# Patient Record
Sex: Male | Born: 1935 | Race: White | Hispanic: No | Marital: Married
Health system: Southern US, Community
[De-identification: ages and names within clinical notes are randomized; demographics above are authoritative.]

---

## 2007-02-22 ENCOUNTER — Ambulatory Visit: Payer: Self-pay | Admitting: Internal Medicine

## 2007-05-14 ENCOUNTER — Ambulatory Visit: Payer: Self-pay | Admitting: Internal Medicine

## 2008-06-02 ENCOUNTER — Ambulatory Visit: Payer: Self-pay | Admitting: Internal Medicine

## 2009-02-04 ENCOUNTER — Ambulatory Visit: Payer: Self-pay | Admitting: Family Medicine

## 2009-02-24 ENCOUNTER — Ambulatory Visit: Payer: Self-pay | Admitting: Family Medicine

## 2009-03-24 ENCOUNTER — Ambulatory Visit: Payer: Self-pay | Admitting: Family Medicine

## 2009-04-23 ENCOUNTER — Ambulatory Visit: Payer: Self-pay | Admitting: Family Medicine

## 2009-09-16 ENCOUNTER — Ambulatory Visit: Payer: Self-pay | Admitting: Gastroenterology

## 2010-09-18 ENCOUNTER — Ambulatory Visit: Payer: Self-pay | Admitting: Family Medicine

## 2010-09-24 ENCOUNTER — Ambulatory Visit: Payer: Self-pay

## 2011-03-15 LAB — COMPREHENSIVE METABOLIC PANEL
Albumin: 3.7 g/dL (ref 3.4–5.0)
Alkaline Phosphatase: 50 U/L (ref 50–136)
BUN: 15 mg/dL (ref 7–18)
Bilirubin,Total: 0.4 mg/dL (ref 0.2–1.0)
Chloride: 106 mmol/L (ref 98–107)
Co2: 25 mmol/L (ref 21–32)
Creatinine: 1.04 mg/dL (ref 0.60–1.30)
EGFR (African American): >60
Potassium: 4.2 mmol/L (ref 3.5–5.1)
SGOT(AST): 25 U/L (ref 15–37)
SGPT (ALT): 25 U/L
Sodium: 142 mmol/L (ref 136–145)
Total Protein: 7 g/dL (ref 6.4–8.2)

## 2011-03-15 LAB — CBC
MCHC: 33.4 g/dL (ref 32.0–36.0)
MCV: 89 fL (ref 80–100)
Platelet: 128 10*3/uL — ABNORMAL LOW (ref 150–440)
RBC: 4.67 10*6/uL (ref 4.40–5.90)
RDW: 15.9 % — ABNORMAL HIGH (ref 11.5–14.5)
WBC: 6.9 10*3/uL (ref 3.8–10.6)

## 2011-03-15 LAB — TROPONIN I: Troponin-I: <0.02 ng/mL

## 2011-03-15 LAB — PROTIME-INR: Prothrombin Time: 22.7 secs — ABNORMAL HIGH (ref 11.5–14.7)

## 2011-03-15 LAB — CK TOTAL AND CKMB (NOT AT ARMC): CK-MB: 3.7 ng/mL — ABNORMAL HIGH (ref 0.5–3.6)

## 2011-03-16 ENCOUNTER — Inpatient Hospital Stay: Payer: Self-pay | Admitting: Student

## 2011-03-16 LAB — CK TOTAL AND CKMB (NOT AT ARMC)
CK, Total: 246 U/L — ABNORMAL HIGH (ref 35–232)
CK-MB: 4.3 ng/mL — ABNORMAL HIGH (ref 0.5–3.6)
CK-MB: 4.2 ng/mL — ABNORMAL HIGH (ref 0.5–3.6)

## 2011-03-16 LAB — PRO B NATRIURETIC PEPTIDE: B-Type Natriuretic Peptide: 50 pg/mL (ref 0–450)

## 2011-03-16 LAB — APTT
Activated PTT: >160 secs (ref 23.6–35.9)
Activated PTT: >160 secs (ref 23.6–35.9)

## 2011-03-16 LAB — TROPONIN I: Troponin-I: <0.02 ng/mL

## 2011-03-17 LAB — BASIC METABOLIC PANEL
Anion Gap: 17 — ABNORMAL HIGH (ref 7–16)
BUN: 12 mg/dL (ref 7–18)
Calcium, Total: 8.8 mg/dL (ref 8.5–10.1)
Chloride: 106 mmol/L (ref 98–107)
Co2: 21 mmol/L (ref 21–32)
Creatinine: 0.89 mg/dL (ref 0.60–1.30)
EGFR (African American): >60
EGFR (Non-African Amer.): >60
Glucose: 113 mg/dL — ABNORMAL HIGH (ref 65–99)
Osmolality: 287 (ref 275–301)
Potassium: 3.9 mmol/L (ref 3.5–5.1)
Sodium: 144 mmol/L (ref 136–145)

## 2011-03-17 LAB — CBC WITH DIFFERENTIAL/PLATELET
Eosinophil #: 0.4 10*3/uL (ref 0.0–0.7)
Eosinophil %: 6.2 %
HCT: 43.9 % (ref 40.0–52.0)
Lymphocyte %: 29.1 %
MCHC: 33.2 g/dL (ref 32.0–36.0)
Monocyte #: 0.6 10*3/uL (ref 0.0–0.7)
Neutrophil %: 53.3 %
Platelet: 137 10*3/uL — ABNORMAL LOW (ref 150–440)
RDW: 16.3 % — ABNORMAL HIGH (ref 11.5–14.5)

## 2011-03-17 LAB — PROTIME-INR
INR: 2.1
Prothrombin Time: 23.5 secs — ABNORMAL HIGH (ref 11.5–14.7)

## 2011-03-17 LAB — MAGNESIUM: Magnesium: 2.2 mg/dL

## 2011-03-17 LAB — APTT: Activated PTT: >160 secs (ref 23.6–35.9)

## 2011-04-03 ENCOUNTER — Emergency Department: Payer: Self-pay | Admitting: Emergency Medicine

## 2011-04-03 LAB — URINALYSIS, COMPLETE
Glucose,UR: NEGATIVE mg/dL (ref 0–75)
Nitrite: POSITIVE
Protein: 30
WBC UR: 139 /HPF (ref 0–5)

## 2011-04-03 LAB — DIFFERENTIAL
Basophil %: 0.7 %
Lymphocyte #: 0.7 10*3/uL — ABNORMAL LOW (ref 1.0–3.6)
Lymphocyte %: 8.3 %
Monocyte %: 7.2 %
Neutrophil #: 7.3 10*3/uL — ABNORMAL HIGH (ref 1.4–6.5)

## 2011-04-03 LAB — CBC
HCT: 43.1 % (ref 40.0–52.0)
HGB: 14.4 g/dL (ref 13.0–18.0)
MCV: 90 fL (ref 80–100)
WBC: 8.8 10*3/uL (ref 3.8–10.6)

## 2011-04-03 LAB — COMPREHENSIVE METABOLIC PANEL
Anion Gap: 14 (ref 7–16)
BUN: 17 mg/dL (ref 7–18)
Chloride: 105 mmol/L (ref 98–107)
Co2: 23 mmol/L (ref 21–32)
Creatinine: 1.1 mg/dL (ref 0.60–1.30)
EGFR (African American): >60
Potassium: 3.7 mmol/L (ref 3.5–5.1)
SGOT(AST): 22 U/L (ref 15–37)
SGPT (ALT): 20 U/L
Sodium: 142 mmol/L (ref 136–145)

## 2011-04-03 LAB — APTT: Activated PTT: 36.8 secs — ABNORMAL HIGH (ref 23.6–35.9)

## 2011-07-03 ENCOUNTER — Ambulatory Visit: Payer: Self-pay

## 2011-07-03 LAB — CBC WITH DIFFERENTIAL/PLATELET
Basophil %: 0.3 %
Eosinophil #: 0.4 10*3/uL (ref 0.0–0.7)
Eosinophil %: 5.4 %
Lymphocyte #: 2.2 10*3/uL (ref 1.0–3.6)
MCH: 30.4 pg (ref 26.0–34.0)
MCV: 91 fL (ref 80–100)
Monocyte #: 0.8 x10 3/mm (ref 0.2–1.0)
Monocyte %: 11.2 %
Neutrophil %: 51.2 %
RDW: 15.5 % — ABNORMAL HIGH (ref 11.5–14.5)

## 2011-07-03 LAB — COMPREHENSIVE METABOLIC PANEL
Albumin: 3.9 g/dL (ref 3.4–5.0)
Alkaline Phosphatase: 73 U/L (ref 50–136)
Chloride: 106 mmol/L (ref 98–107)
Co2: 24 mmol/L (ref 21–32)
Creatinine: 1.01 mg/dL (ref 0.60–1.30)
EGFR (African American): >60
Glucose: 117 mg/dL — ABNORMAL HIGH (ref 65–99)
Potassium: 4 mmol/L (ref 3.5–5.1)
SGOT(AST): 24 U/L (ref 15–37)
SGPT (ALT): 29 U/L
Total Protein: 7.6 g/dL (ref 6.4–8.2)

## 2011-07-03 LAB — URINALYSIS, COMPLETE
Blood: NEGATIVE
Glucose,UR: NEGATIVE mg/dL (ref 0–75)
Leukocyte Esterase: NEGATIVE
Specific Gravity: 1.02 (ref 1.003–1.030)
Squamous Epithelial: NONE SEEN

## 2011-07-03 LAB — LIPASE, BLOOD: Lipase: 112 U/L (ref 73–393)

## 2011-08-04 ENCOUNTER — Ambulatory Visit: Payer: Self-pay | Admitting: Gastroenterology

## 2011-08-31 ENCOUNTER — Ambulatory Visit: Payer: Self-pay | Admitting: Internal Medicine

## 2013-11-14 IMAGING — CT CT STONE STUDY
1 of 2 series · 15 of 32 positions shown, 19 images · non-contrast
Comparison: none

REASON FOR EXAM: LLQ pain and difficulty urinating
COMMENTS:

PROCEDURE:     CT  - CT ABDOMEN /PELVIS WO (STONE)  - April 03, 2011 [DATE]
RESULT:     Comparison: 04/23/2009
TECHNIQUE: Multiple axial images from the lung bases to the symphysis pubis
were obtained without oral and without intravenous contrast.

[Series 2: stone · axial · 0.76mm/px · z∈[-398,+20]mm · 15 of 151 slices shown, 19 images]
[im 6/151  soft-tissue]
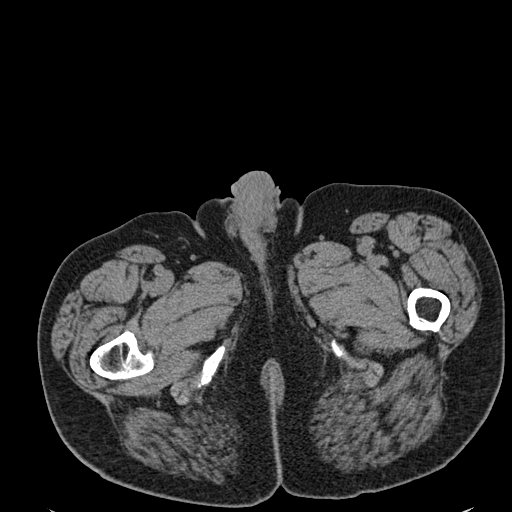
[im 6/151  bone]
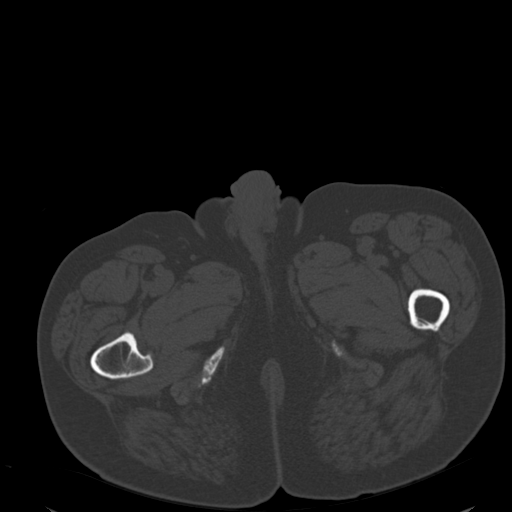
[im 18/151  soft-tissue]
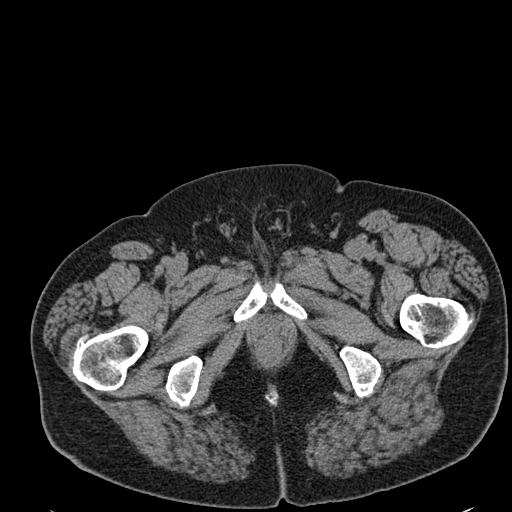
[im 29/151  soft-tissue]
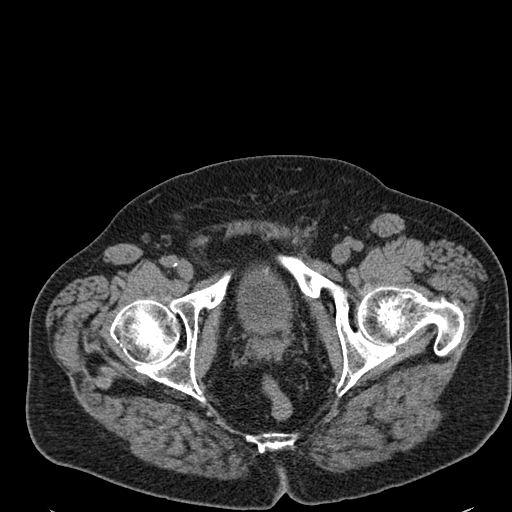
[im 41/151  soft-tissue]
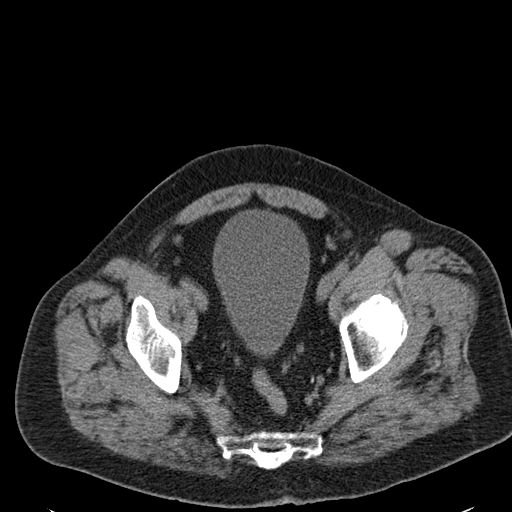
[im 52/151  soft-tissue]
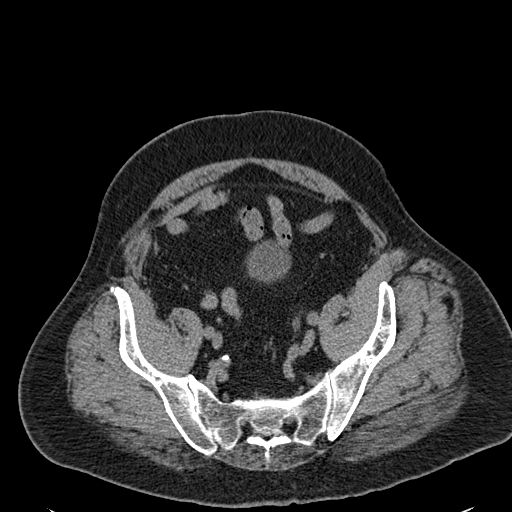
[im 64/151  soft-tissue]
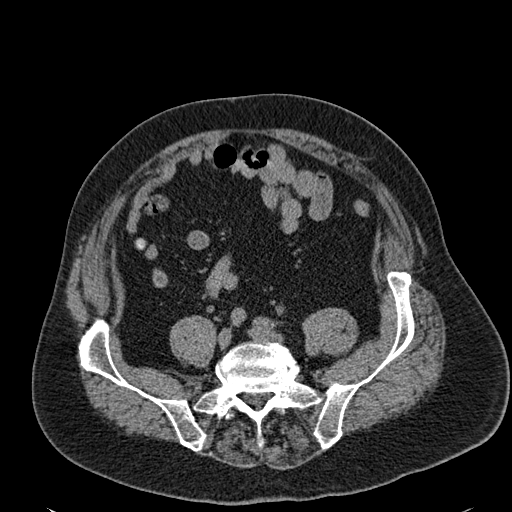
[im 76/151  soft-tissue]
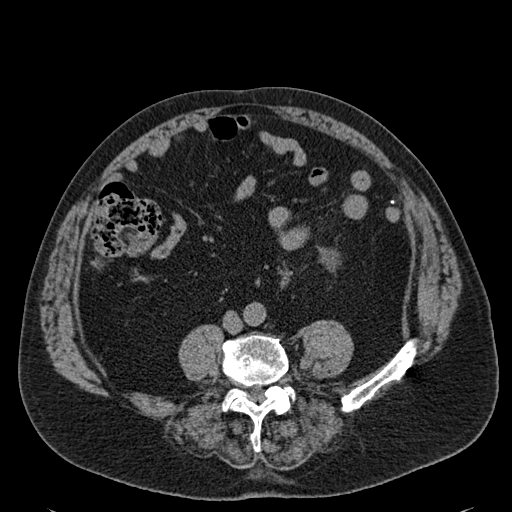
[im 87/151  soft-tissue]
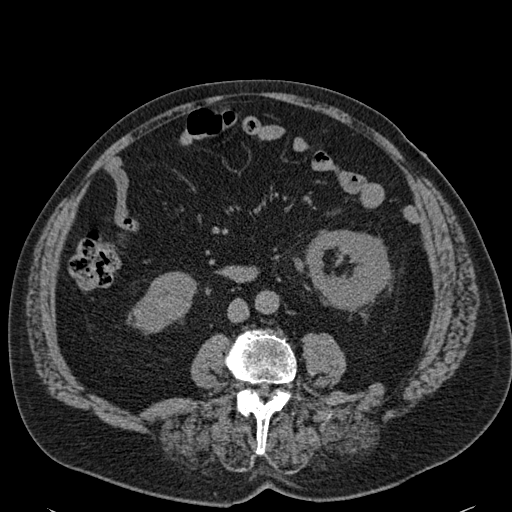
[im 99/151  soft-tissue]
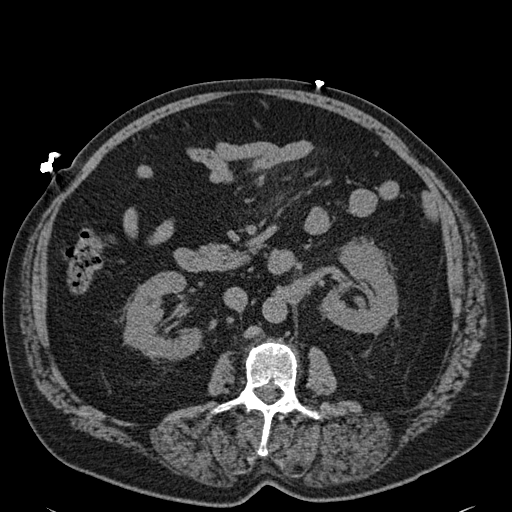
[im 99/151  bone]
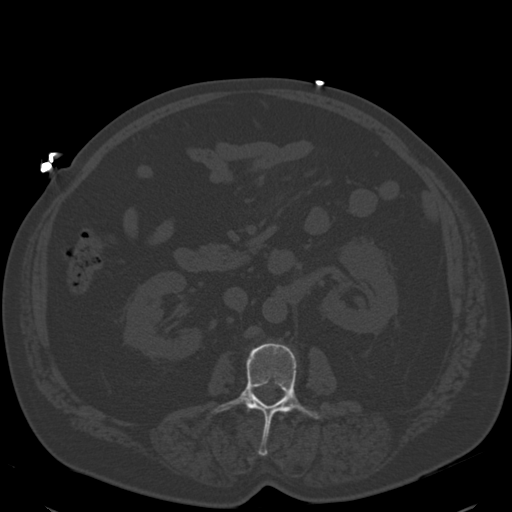
[im 110/151  soft-tissue]
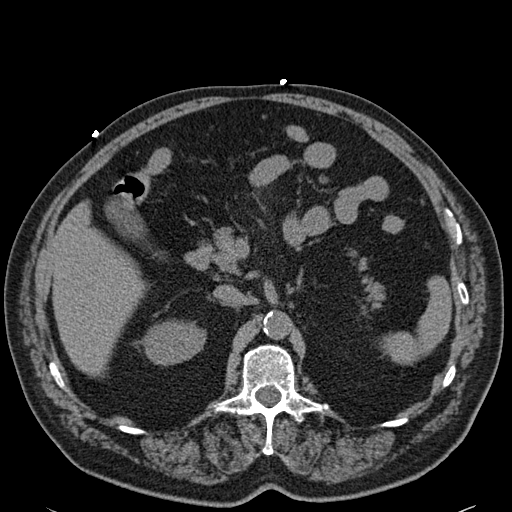
[im 122/151  soft-tissue]
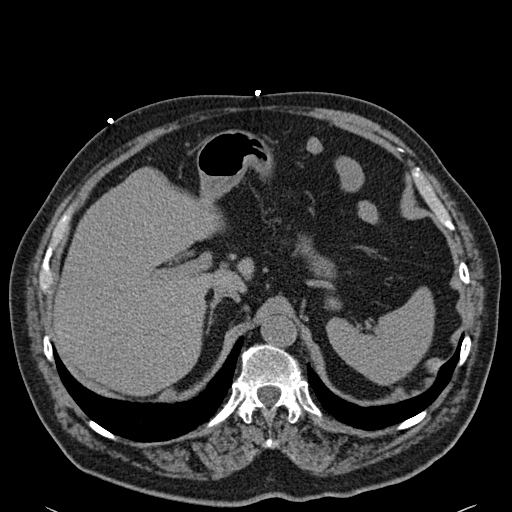
[im 127/151  lung]
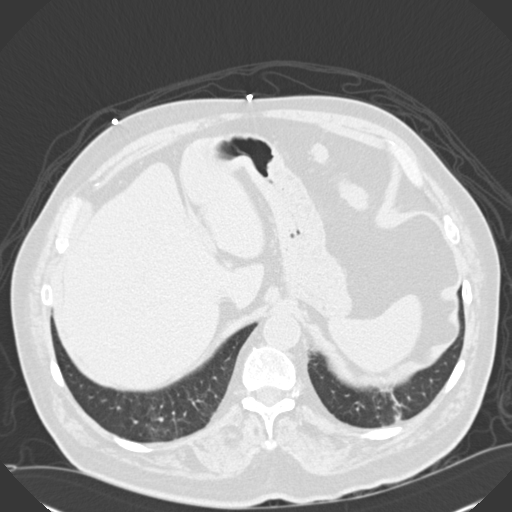
[im 133/151  soft-tissue]
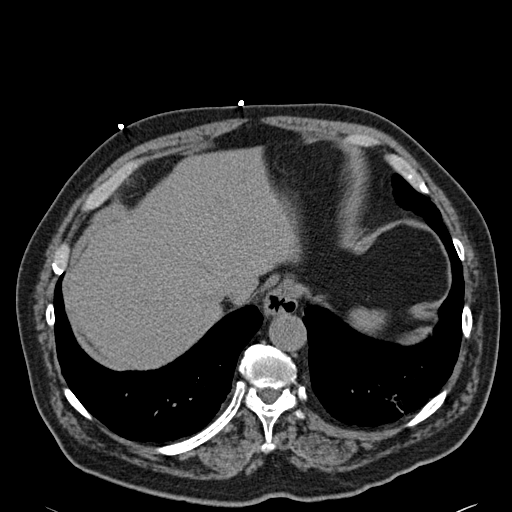
[im 133/151  lung]
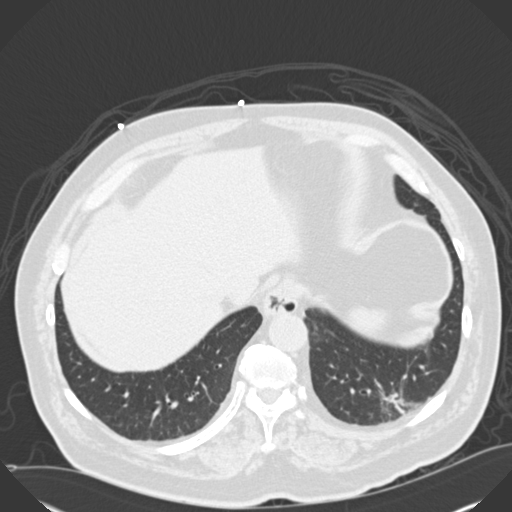
[im 139/151  lung]
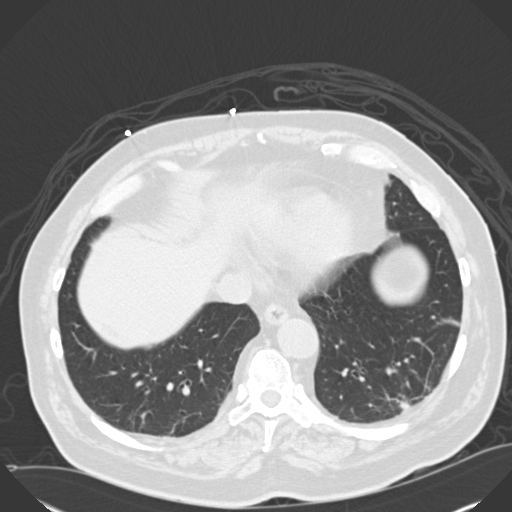
[im 145/151  soft-tissue]
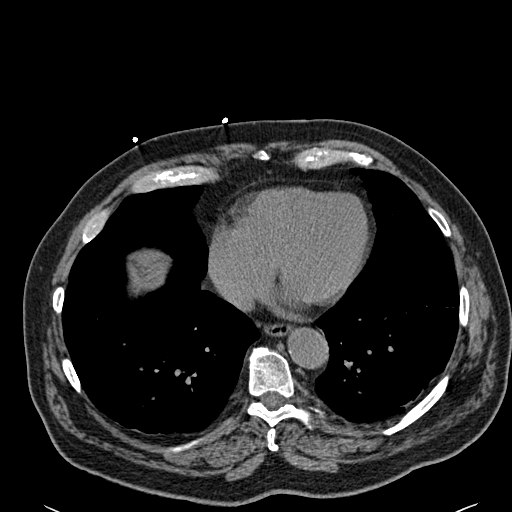
[im 145/151  lung]
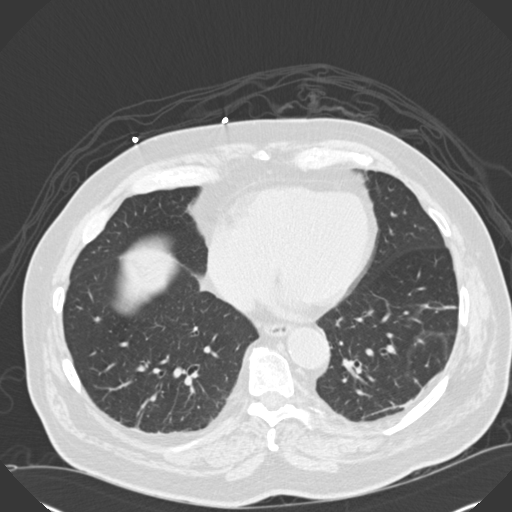

[15 of 32 positions shown; findings below may reference images not displayed]

FINDINGS: Linear opacities in the left lower lobe likely represent atelectasis.

Lack of intravenous contrast limits evaluation of the solid abdominal
organs.  Grossly, the liver, gallbladder, spleen, adrenals, and pancreas are
unremarkable. There is a small hiatal hernia. A 2 mm calculus is seen in the
interpolar region of the right kidney. There is slight prominence of the
left ureter and mild left periureteral stranding which is nonspecific. No
calculus identified in the left ureter. There is mild perinephric stranding
bilaterally, which is nonspecific.

The small and large bowel are normal in caliber. There is minimal stranding
in the mesenteric fat, which is nonspecific. The appendix is not identified.
There are no inflammatory changes at the base of the cecum. There are
nonspecific calcifications in the prostate.

No aggressive lytic or sclerotic osseous lesions are identified.
IMPRESSION: 1. Mild prominence of the left ureter and mild left periureteral stranding
is nonspecific. This may be infectious or inflammatory. Correlate for
recently passed stone.
2. Right-sided nephrolithiasis, without hydronephrosis.

## 2014-03-17 IMAGING — CT CT ABD-PELV W/ CM
1 of 2 series · 15 of 32 positions shown, 19 images · IV contrast (isovue)
Comparison: 04/03/2011

REASON FOR EXAM: gastroenteritis  generalized abd pain
COMMENTS:

PROCEDURE:     GAZOUANI - GAZOUANI ABDOMEN / PELVIS W  - August 04, 2011 [DATE]
RESULT:     History: Gastroenteritis
TECHNIQUE: Multiple axial images of the abdomen and pelvis were performed
from the lung bases to the pubic symphysis, with p.o. contrast and with 100
ml of Isovue 300 intravenous contrast.

[Series 2: soft tissue · axial · 0.84mm/px · z∈[-548,-122]mm · 15 of 156 slices shown, 19 images]
[im 7/156  soft-tissue]
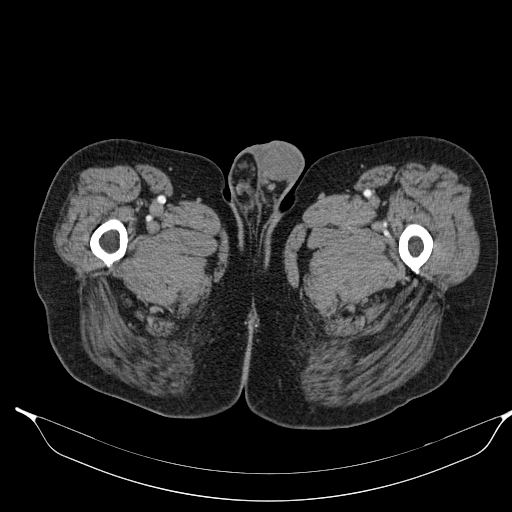
[im 7/156  bone]
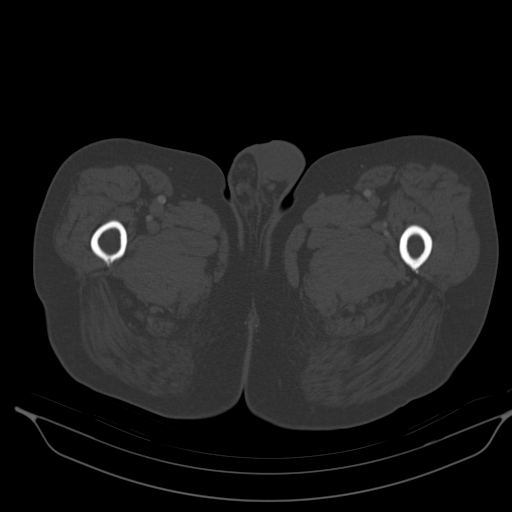
[im 20/156  soft-tissue]
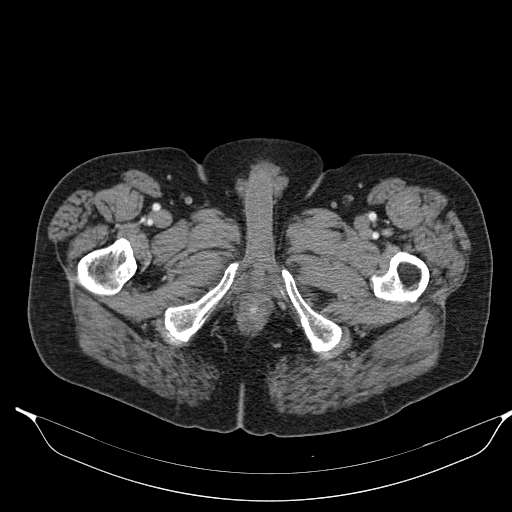
[im 33/156  soft-tissue]
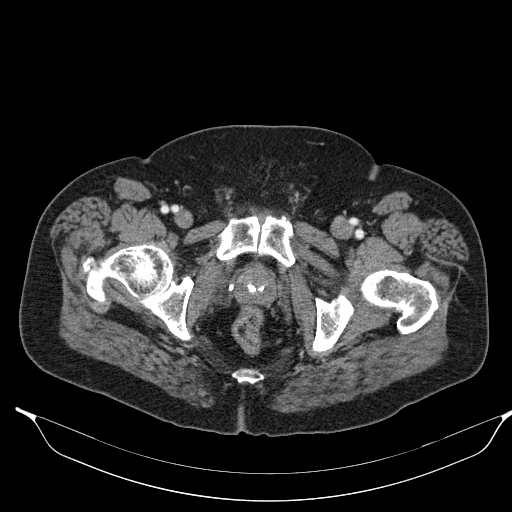
[im 46/156  soft-tissue]
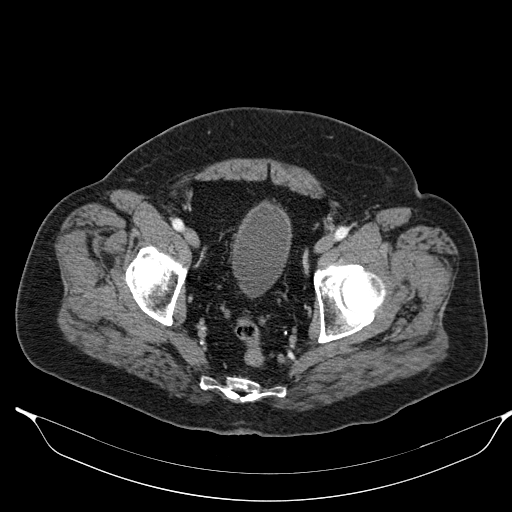
[im 52/156  soft-tissue]
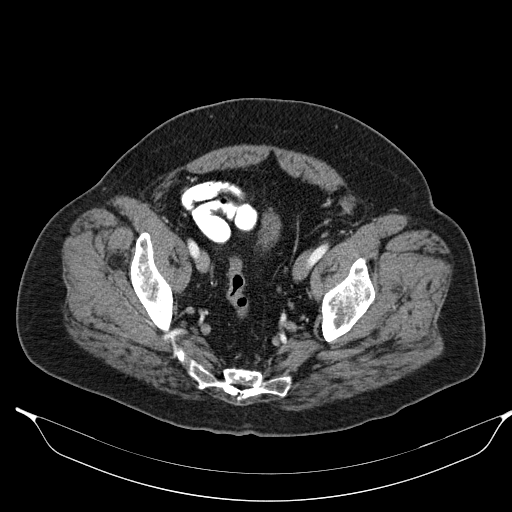
[im 65/156  soft-tissue]
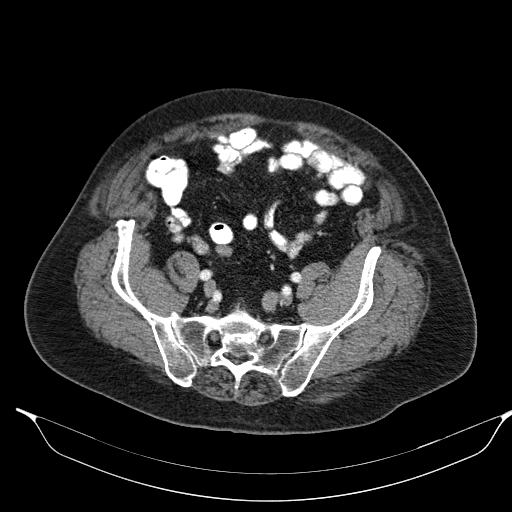
[im 78/156  soft-tissue]
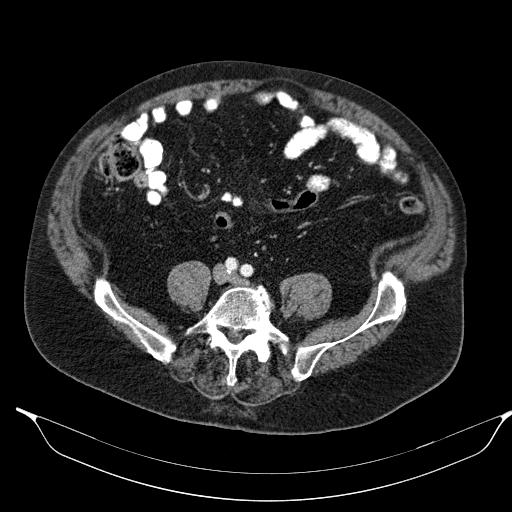
[im 91/156  soft-tissue]
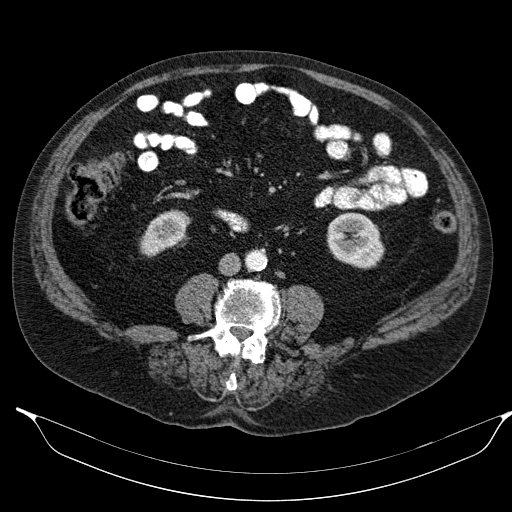
[im 104/156  soft-tissue]
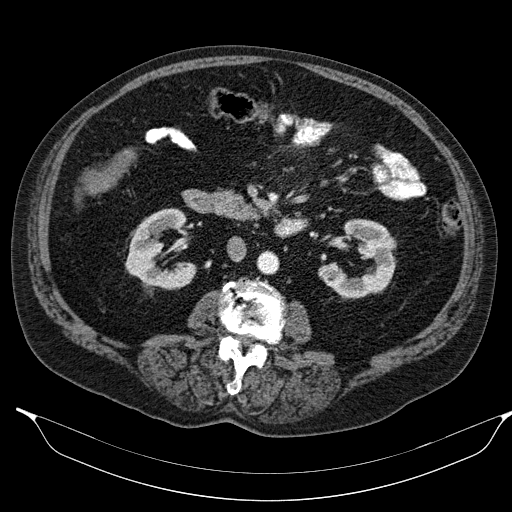
[im 104/156  bone]
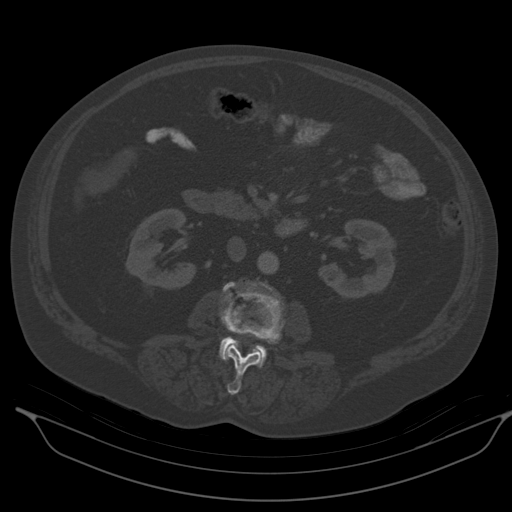
[im 110/156  soft-tissue]
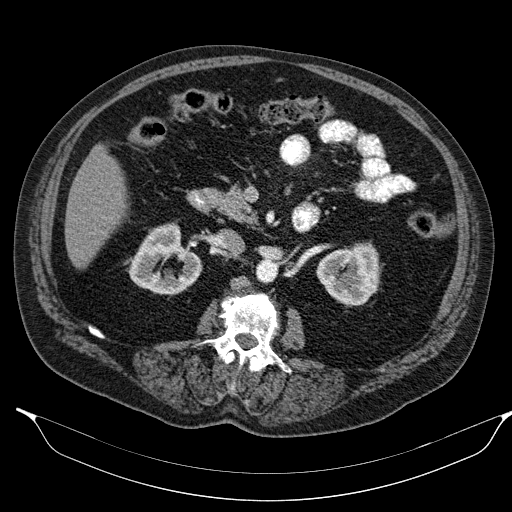
[im 123/156  soft-tissue]
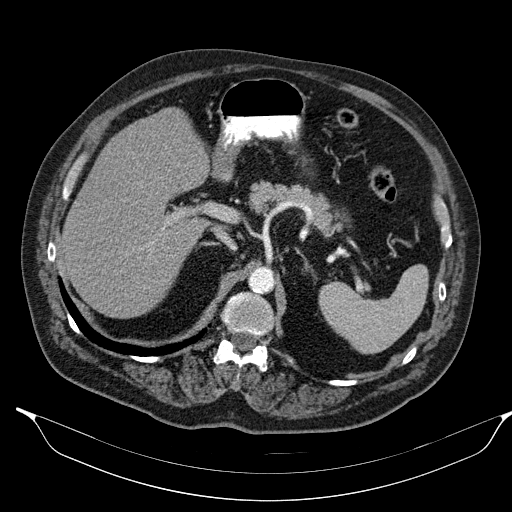
[im 130/156  lung]
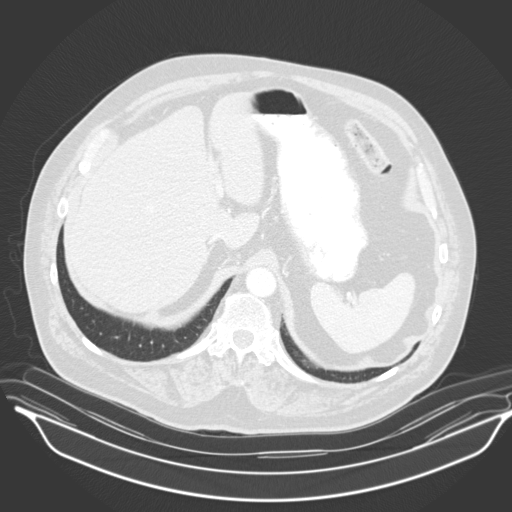
[im 136/156  soft-tissue]
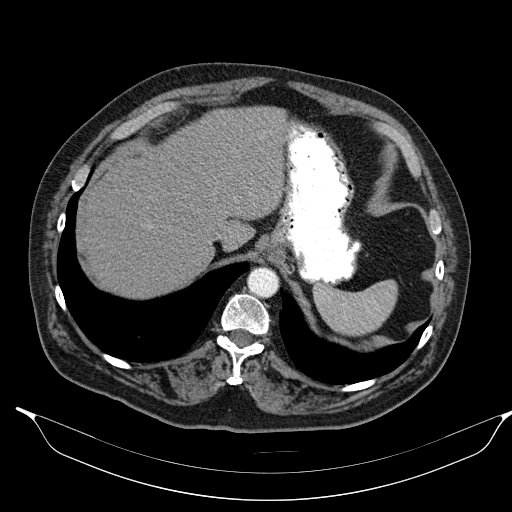
[im 136/156  lung]
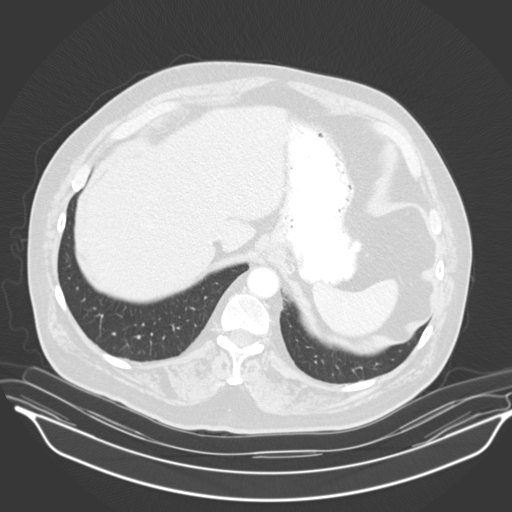
[im 143/156  lung]
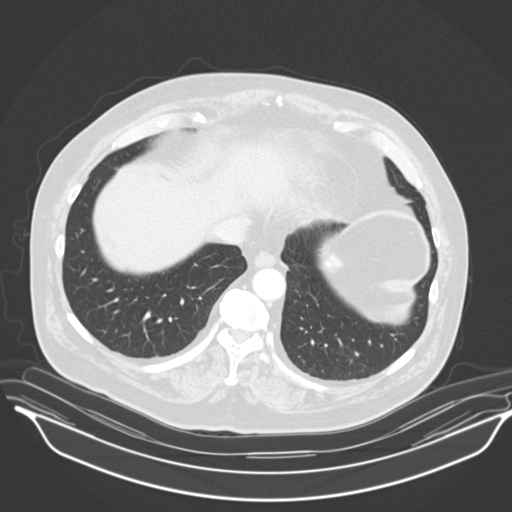
[im 149/156  soft-tissue]
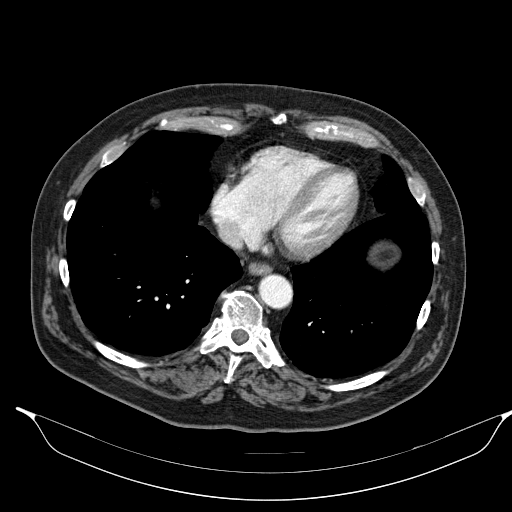
[im 149/156  lung]
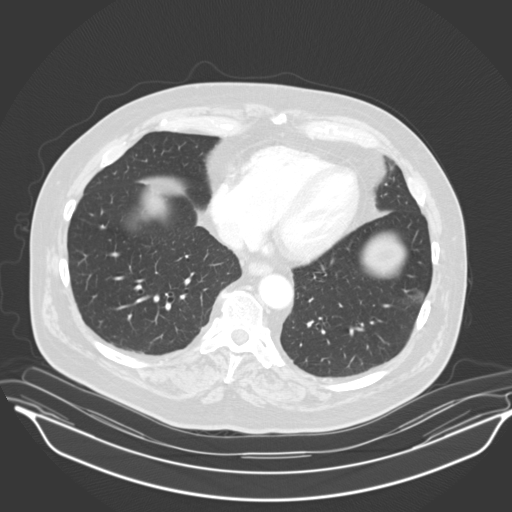

[15 of 32 positions shown; findings below may reference images not displayed]

FINDINGS: The lung bases are clear. There is no pneumothorax. The heart size is
normal.

The liver demonstrates no focal abnormality. There is no intrahepatic or
extrahepatic biliary ductal dilatation. The gallbladder is unremarkable. The
spleen demonstrates no focal abnormality. The kidneys, adrenal glands, and
pancreas are normal. The bladder is unremarkable.

The stomach, duodenum, small intestine, and large intestine demonstrate no
contrast extravasation or dilatation. There is mild haziness in the left
side of the mesentery with a few nonpathologically enlarged lymph nodes
present as can be seen with mesenteritis or panniculitis which is similar in
appearance to the prior examination of 04/03/2011. There is no
pneumoperitoneum, pneumatosis, or portal venous gas. There is no abdominal
or pelvic free fluid. There is no lymphadenopathy.

The abdominal aorta is normal in caliber with atherosclerosis.

The osseous structures are unremarkable.
IMPRESSION: 1. There is mild haziness in the left side of the mesentery with a few
nonpathologically enlarged lymph nodes present as can be seen with
mesenteritis or panniculitis which is similar in appearance to the prior
examination of 04/03/2011.

[REDACTED]

## 2014-05-18 NOTE — Discharge Summary (Signed)
PATIENT NAME:  Austin Price, Austin Price MR#:  [id number] DATE OF BIRTH:  07-11-1932  DATE OF ADMISSION:  03/16/2011 DATE OF DISCHARGE:  03/17/2011  CHIEF COMPLAINT: Shortness of breath.   DISCHARGE DIAGNOSIS: Shortness of breath likely in the setting of previous pulmonary embolus.   SECONDARY DIAGNOSES:  1. Recent diagnosis of PE and DVT with questionable right heart strain in December 2012 at [facility], on Coumadin.  2. Vitamin D deficiency.  3. Osteopenia.  4. Diet controlled diabetes.  5. Hyperlipidemia.  6. Gastroesophageal reflux disease.  7. Colonic polyp.  8. Diverticulosis. 9. Hemorrhoids.  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  11. Hypogonadism.  12. Situational anxiety.  13. Thrombocytopenia, chronic.  14. Obstructive sleep apnea.  15. Degenerative joint disease and spinal stenosis.   DISCHARGE MEDICATIONS:  1. Coumadin 5 mg p.o. daily.  2. Benzonatate 100 mg 3 times a day as needed.  3. Prednisone 5 mg daily.  4. Aspirin 81 mg daily.  5. Magnesium oxide 250 mg daily.  6. Levothyroxine 50 mcg daily.  7. Co-Q Enzyme 10 200 mg once a day.  8. Atorvastatin 20 mg daily.  9. Cyclobenzaprine 5 mg at bedtime.  10. Calcium with Vitamin D 1 tab in the p.m.  11. Vitamin D3 2000 international units daily.  12. Omeprazole 40 mg daily.   FOLLOW-UP: Please follow-up with your primary care physician and check INR within the next week and iron goal should be between 2.5 to 3.   ACTIVITY: As tolerated.   DIET: Low sodium, ADA diet.   DISPOSITION: Home.   CONSULTANTS:  1. Dr. Sherrlyn HockPandit from Hematology   2. Dr. Purcell NailsKleinman from Dermatology    HISTORY OF PRESENT ILLNESS: The patient is a 81 year old gentleman with history of recent PE and DVT who was hospitalized in ICU requiring careful monitoring high FiO2 and was discharged on oxygen and Coumadin who presented with shortness of breath and left lower extremity swelling. For full details, please see the history and physical  dictated on 03/16/2011 by Dr. Margie EgePhichith. The patient was admitted to the hospitalist service for further evaluation of the shortness of breath.   SIGNIFICANT LABS AND IMAGING: BNP 50. Initial creatinine 1.04, sodium 142, potassium 4.2. DVT scan via ultrasound of the left lower extremity did not show any evidence for DVT. LFTs within normal limits. Troponins have been negative x3. Initial WBC 6.9, hemoglobin 13.6, platelets 128. INR on arrival 2; INR on discharge 2.1. D-dimer done this morning was 0.44 essentially within normal limits on the upper limits side. CT chest PE protocol showing pulmonary embolus within the right main pulmonary artery. X-ray of the chest, one view, showing no acute cardiopulmonary disease.   HOSPITAL COURSE: The patient was admitted on telemetry and started on heparin drip and initially Coumadin was held. Initial thought was perhaps the patient had a repeat PE in the setting of a DVT from the swelling in the left lower extremity. The patient underwent a Doppler of the lower extremity which did not show any DVT. Troponins were cycled to rule out acute coronary syndrome which have been all negative. While hospitalized the patient did not have any further episodes of shortness of breath or have any chest pain. The left lower extremity resolved spontaneously. Hematology was consulted. The patient also complained of a rash in the extremities. Furthermore, Dermatology was consulted. Dr. Sherrlyn HockPandit saw the patient from Hematology and recommended repeating a CAT scan as well as a D-dimer. The CAT scan did show pulmonary embolism in  the right lung, however, this appears to be a much lower burden from the previous PE. Per outpatient records we obtained from [facility], there is a reading that shows acute bilateral central and, to a lesser extent, distal PE moderate to severe clot burden. Here the D-dimer was on the higher side of normal, however, essentially negative.  Furthermore, the CAT scan here showed lesser clot burden. Dr. Sherrlyn Hock gave option of Lovenox versus Coumadin with a higher INR goal of 2.5 to 3 and the patient chose Coumadin with INR at a higher goal rate. At this point, he has had no further shortness of breath, has improvement and resolution of the lower extremity edema. He will be discharged with Coumadin 5 mg dose. INR should be followed up as an outpatient within the next week and Coumadin titrated for that higher goal. He was essentially ruled out for acute coronary syndrome with cyclic troponins x3. A BNP was checked and it was within normal limits suggesting the shortness of breath which resolved spontaneously as well shortly after hospitalization was not CHF related. For the petechiae, he was seen by Dermatology who believed that this rash which involves the lower extremity mostly is likely in the setting of lower extremity edema and no sign of vasculitis was seen. The petechiae has improved since arrival. He can follow-up with Dermatology as an outpatient if needed. At this point, given the resolution of his symptoms of shortness of breath and lower extremity edema, he will be discharged with the higher dose of Coumadin.   TOTAL TIME SPENT: 35 minutes.   CODE STATUS: The patient is FULL CODE.  ____________________________ Krystal Eaton, MD sa:drc D: 03/17/2011 10:35:16 ET T: 03/17/2011 11:48:40 ET JOB#: [id number]  cc: Krystal Eaton, MD, <Dictator> Jorje Guild. Beckey Downing, MD Sandeep R. Sherrlyn Hock, MD Krystal Eaton MD ELECTRONICALLY SIGNED 03/26/2011 8:56

## 2014-05-18 NOTE — H&P (Signed)
PATIENT NAME:  Austin Price, Austin Price MR#:  [id number] DATE OF BIRTH:  10/02/1932  DATE OF ADMISSION:  03/16/2011  REFERRING PHYSICIAN: Dr. Carollee MassedKaminski PRIMARY CARE PHYSICIAN: Mariah Millinganica Glass, FNP with Dr. Beckey DowningWillett    PRESENTING COMPLAINT: Chest pain, shortness of breath, left lower extremity swelling.   HISTORY OF PRESENT ILLNESS: Austin Price is a pleasant 81 year old gentleman with history of recent diagnosis of PE, deep vein thrombosis in December 2012 at [facility] on Coumadin, history of postpolio syndrome on steroids, osteopenia, diet controlled diabetes, hyperlipidemia, obstructive sleep apnea, spinal stenosis who presents with reports of developing left lower extremity swelling yesterday with diffuse body rash and development of shortness of breath at rest and on exertion as well as left-sided chest pain that has been on and off similar to when he presented in [place] urgent care. Apparently shortly before Christmas patient was visiting his [place] in [place] when he developed worsening shortness of breath and chest pain. Reports that his shortness of breath initially began prior to going to [facility] but was diagnosed with bronchitis. When symptoms worsened he got evaluated at urgent care. Apparently had syncope and collapse and was transferred to [facility] where he was diagnosed with what sounds like bilateral PE and deep venous thrombosis with right heart strain. Patient apparently was in the Intensive Care Unit. I do not have records available. His wife sent records home with her son. He denies any syncope or collapse this evening. No palpitations. Reports that his chest pain and shortness of breath seems improved as well as left lower extremity has improved since being here.   PAST MEDICAL HISTORY:  1. Recent diagnosis of PE and deep vein thrombosis and questionable right heart strain in December 2012 at [facility] on Coumadin.   2. Vitamin D  deficiency.  3. Osteopenia.  4. Diet-controlled diabetes.  5. Hyperlipidemia.  6. Gastroesophageal reflux disease.  7. Colonic polyps.  8. Diverticulosis.  9. Hemorrhoids.  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  11. Hypogonadism.  12. Situational anxiety.  13. Thrombocytopenia, chronic.  14. Obstructive sleep apnea. 15. Degenerative disc disease and spinal stenosis.   ALLERGIES: Penicillin.   MEDICATIONS:  1. Chlorpheniramine maleate 4 mg daily.  2. Calcium 500 mg 1 tablet daily.  3. Prednisone 5 mg daily.  4. Magnesium 250 mg daily.  5. Flexeril 5 mg 1 to 2 tablets at bedtime as needed; patient takes 5 mg at bedtime.  6. Aspirin 81 mg daily.  7. Atorvastatin 20 mg daily.  8. Tramadol 50 mg 1 to 2 tablets Price.i.d. as needed.  9. Coumadin 4 mg alternating with 5 mg; he is due for 5 mg dosing on Wednesday.   PAST SURGICAL HISTORY:  1. Left knee surgery x2.  2. Right knee surgery.  3. Appendectomy.  4. Tonsillectomy.  5. Bilateral cataract surgery.   FAMILY HISTORY: Cancer, heart disease, thyroid disease.   SOCIAL HISTORY: Lives in [place] with [place] with his wife. He quit tobacco 40 years ago. No drug use. He has occasional alcohol use.   REVIEW OF SYSTEMS: CONSTITUTIONAL: No fevers, nausea, vomiting. EYES: He has history of cataracts. ENT: No epistaxis or discharge. RESPIRATORY: No cough, wheezing. Reports shortness of breath at rest and dyspnea on exertion. No hemoptysis. CARDIOVASCULAR: Left-sided chest pain as per history of present illness and left lower extremity edema. No palpitations. He reports some intermittent dizziness. GENITOURINARY: No dysuria, hematuria. ENDO: No polyuria or polydipsia. HEMATOLOGIC: He has easy bruising. SKIN: Reports diffuse  rash of the lower extremity as well as face and body. MUSCULOSKELETAL: He has chronic joint pain and leg pain and back pain. NEUROLOGIC: No prior history of strokes or seizures. PSYCH: Denies any depression or suicidal  ideation.   PHYSICAL EXAMINATION:  VITAL SIGNS: Temperature 97.6, pulse 76, respiratory rate 18, blood pressure 142/73, sating at 95% on room air.   GENERAL: Lying in bed in no apparent distress.   HEENT: Normocephalic, atraumatic. Pupils equal, symmetric, nonicteric sclerae. Nares without discharge. Moist mucous membrane.   NECK: Soft supple. No adenopathy or JVP.   CARDIOVASCULAR: Non-tachy. No murmurs, rubs, or gallops.   LUNGS: Faint basilar crackles. No use of accessory muscles or increased respiratory effort.   ABDOMEN: Soft. Positive bowel sounds. No mass appreciated.   EXTREMITIES: 2+ pitting edema of the left lower extremity and 1+ of the right lower extremity. Dorsal pedis pulses intact. No erythema or warmth.   MUSCULOSKELETAL: No joint effusion.   SKIN: Diffuse petechial rash of the lower extremity as well as trunk and neck and face.   PSYCH: He is alert and oriented. Patient is cooperative.   NEUROLOGIC: Symmetrical strength. No focal deficits.   LABORATORY, DIAGNOSTIC AND RADIOLOGICAL DATA: CK 224. MB 3.7. Troponin less than 0.02. INR 2. Glucose 145, BUN 15, creatinine 1.04, sodium 142, potassium 4.2, chloride 106, carbon dioxide 25, calcium 8.8. LFTs within normal limits. WBC 6.9, hemoglobin 13.9, hematocrit 41.6, platelets 128, MCV 89. EKG with sinus rate of 81. No ST changes. Left lower extremity Doppler without evidence of deep vein thrombosis. There is perhaps Baker's cyst in the left popliteal region.   ASSESSMENT AND PLAN: Austin Price is a pleasant 81 year old gentleman with history diet-controlled diabetes, postpolio syndrome, spinal stenosis, obstructive sleep apnea, thrombocytopenia, recent diagnosis of bilateral PE and deep vein thrombosis December 2012 now on Coumadin presenting with acute onset of left lower extremity edema, chest pain, shortness of breath and diffuse rash. 1. Chest pain, shortness of breath concerning for recurrent deep venous thrombosis and  pulmonary embolus despite therapeutic INR. Left lower extremity swelling improved. Doppler, however, is negative. Per patient does not recall getting Doppler's performed at [facility]. Clinical and by history patient potentially could have developed acute clot now passed into his lungs and reason for development of acute shortness of breath and chest pain similar to his episode back in [place]. Will follow PT/INR but stop Coumadin for now. Start on heparin drip until seen by hematology. Get records from [facility]. Await recommendations from hematology prior to repeating CT chest. Continue on telemetry and cycle cardiac enzymes.  2. Diffuse rash, appears petechial platelet, however has remained stable. Questionable drug reaction. Get derm evaluation.   3. Diabetes, diet-controlled. Sliding scale insulin.  4. Postpolio syndrome. Restart his prednisone and Flexeril.  5. Hyperlipidemia. Restart atorvastatin.  6. Prophylaxis. On Coumadin and heparin drip. Will hold aspirin and continue Protonix.   TIME SPENT: Approximately 45 minutes spent on patient care.    ____________________________ Reuel Derby, MD ap:cms D: 03/16/2011 02:02:48 ET T: 03/16/2011 07:03:07 ET JOB#: [id number]  cc: Pearlean Brownie Gilmore List, MD, <Dictator> Coralyn Pear, FNP Reuel Derby MD ELECTRONICALLY SIGNED 03/22/2011 0:50

## 2014-05-18 NOTE — Consult Note (Signed)
PATIENT NAME:  Austin Price, Austin Price MR#:  [id number] DATE OF BIRTH:  1932/10/23  DATE OF CONSULTATION:  03/16/2011  REFERRING PHYSICIAN:  Alounthith Phichith, MD  CONSULTING PHYSICIAN:  Mack Thurmon R. Sherrlyn Hock, MD  REASON FOR CONSULTATION: Recent diagnosis of PE/deep vein thrombosis with? recurrence on Coumadin.   HISTORY OF PRESENT ILLNESS: The patient is a 81 year old gentleman with past medical history significant for diet-controlled diabetes, gastroesophageal reflux disease, hyperlipidemia, colon polyps, vitamin D deficiency, diverticulosis, hemorrhoids, postpolio syndrome, hypogonadism, anxiety, chronic thrombocytopenia, obstructive sleep apnea, degenerative disk disease and spinal stenosis, who states that he developed acute onset chest pain and shortness of breath around December 22nd and was in [place] and was evaluated at the nearby [facility]. Review of reports available from this shows that a CT scan of the chest on 01/15/2011 showed acute bilateral central pulmonary emboli and to a lesser extent distal pulmonary embolism, moderate-to-severe clot burden, multiple small mediastinal lymph nodes. The patient states that he was in the Intensive Care Unit, initially was on heparin, then on Lovenox and subsequently has been on Coumadin anticoagulation. He states that Coumadin has been regularly monitored and has been in the 2+ range. He was admitted to the hospital here last night with complaints of recurrent shortness of breath with sharp chest pain and also had noticed left lower extremity swelling below the knee. Evaluation so far shows  INR on admission was 2.0, left lower extremity Doppler negative for deep vein thrombosis, likely Baker's cyst in the popliteal fossa. The patient also had CT scan of the chest with PE protocol done today which reports pulmonary embolus present in the right main pulmonary artery, otherwise adequate opacification of pulmonary arteries seen. No  mediastinal adenopathy was reported. The patient has been on IV heparin since last night, states that left lower extremity swelling has significantly improved, denies pain. He also states that shortness of breath has also significantly improved and denies any chest pain currently. He denies bleeding symptoms. He did not have hypoxia, and the lowest reading of pulse oximetry was 91% on room air earlier this afternoon, but the patient clinically did not have worsening dyspnea or chest pain.   PAST MEDICAL HISTORY: As in history of present illness above.   PAST SURGICAL HISTORY:  1. Left knee surgery x2. 2. Right knee surgery.  3. Appendectomy.  4. Tonsillectomy. 5. Bilateral cataract surgery.   FAMILY HISTORY: Remarkable for heart disease, thyroid disease and malignancy. Denies hematological disorders, including thromboembolic phenomena.   SOCIAL HISTORY: He is a remote smoker, quit 40 years ago. He has occasional alcohol intake. He lives in [place], alternating with [employer]. His wife is present at bedside.   ALLERGIES: Penicillin.   HOME MEDICATIONS:  1. Coumadin 4 mg alternating with 5 mg every other day.  2. Calcium 500 mg daily.  3. Prednisone 5 mg daily.  4. Magnesium 250 mg daily.  5. Chlorpheniramine 4 mg daily.  6. Flexeril 5 to 10 mg at bedtime p.r.n.  7. Aspirin 81 mg daily.  8. Atorvastatin 20 mg daily.  9. Tramadol 50 mg, 1 to 2 tablets b.i.d. as needed.   REVIEW OF SYSTEMS: CONSTITUTIONAL: Dyspnea on exertion as described above. Tries to remain active as much as possible. No fevers, chills, or night sweats. HEENT: Denies any dizziness, headaches, epistaxis, ear or jaw pain. LUNGS: As in history of present illness. CARDIAC: Had chest pain last night. It is better at this time. No palpitation, orthopnea, or paroxysmal nocturnal dyspnea. GASTROINTESTINAL:  No nausea, vomiting, or diarrhea. No bright red blood in stools or melena. GENITOURINARY: No dysuria or hematuria. SKIN: No  new rashes or pruritus. HEMATOLOGIC: No obvious bleeding symptoms except for easy skin bruising. NEUROLOGIC: No new focal weakness, seizures, or loss of consciousness. MUSCULOSKELETAL: Chronic joint pains and lower extremity pain which is unchanged, no new bone pain. ENDOCRINE: No polyuria, polydipsia. Appetite is steady.   PHYSICAL EXAMINATION:  GENERAL: The patient is a moderately built obese individual, resting in bed, alert and oriented and converses appropriately. No acute distress. No icterus.   VITAL SIGNS: Temperature 98.3, pulse 70, respiratory rate 20, blood pressure 116/73, 91% on room air.   HEENT: Normocephalic, atraumatic. Extraocular movements intact. Sclerae are anicteric. No oral thrush.   NECK: Supple without lymphadenopathy.   CARDIOVASCULAR: S1 and S2, regular rate and rhythm.   LUNGS: Lungs show bilateral good air entry, a few basilar crackles. No rhonchi.   ABDOMEN: Soft. No hepatosplenomegaly clinically.   EXTREMITIES: Trace edema in the left leg. No cyanosis.   MUSCULOSKELETAL: No obvious joint deformity.   SKIN: Has faint petechial rash over lower extremity which is improving, per patient report.   NEUROLOGIC: Cranial nerves are intact. Moves all extremities spontaneously.   LABORATORY, DIAGNOSTIC, AND RADIOLOGICAL DATA: As in history of present illness eight. In addition, CK-MB up to 4.3, troponin I less than 0.02. Creatinine 1.04, potassium 4.2, calcium 8.8. Liver functions are unremarkable. CBC upon admission showed WBC 6900, hemoglobin 13.9, platelets 128.   IMPRESSION AND RECOMMENDATIONS: The patient is a 81 year old gentleman with history of multiple medical problems as described above, who was diagnosed with what appears to be unprovoked bilateral pulmonary embolism on 01/15/2011 and has been on anticoagulation with Coumadin, reportedly INR has been greater than 2, upon admission here INR was exactly 2.0 which is at the lower end of the therapeutic range.  The patient clinically had symptoms of chest pain and shortness of breath which seems to have improved very quickly. He also reportedly had some left leg swelling which has quickly improved, and venous Doppler is negative for deep vein thrombosis in the left lower extremity. Repeat CT scan of the chest done today reports pulmonary embolus present only in the right main pulmonary artery, as described above CT chest on December 22nd had shown acute bilateral central and to a lesser extent distal pulmonary emboli with moderate-to-severe clot burden. Given this, it appears that clot burden is lesser at this time; and with rapid improvement in symptoms, it is unclear if these are related to the pulmonary embolus found on CT scan. The patient currently is on unfractionated IV heparin. I have discussed with the patient regarding further options, including pursuing low molecular weight heparin like Lovenox injections twice daily for the next four weeks and then switch back to Coumadin once we make sure he does not develop recurrent symptoms, versus switching back to Coumadin at this time and try to maintain INR at the higher side of therapeutic range (aim for INR 2.5 to 3). The patient prefers to take oral Coumadin and does not want to do Lovenox injections. I recommend restarting Coumadin, repeat PT/INR tomorrow. Also, I will get D-dimer drawn tomorrow morning. He can continue getting anticoagulation monitored by his primary physician. The patient also has mild thrombocytopenia, which is reportedly a chronic finding. I  also recommend monitoring CBC and platelet counts when he gets PT/INR checked. I do not see the need for scheduled Hematology follow-up, but would be happy to see  him back as an outpatient if he has issues with anticoagulation, recurrent thromboembolic phenomena, or worsening thrombocytopenia. The patient and wife present were explained the above, they are agreeable to this plan.   Thank you for the  referral. Please feel free to contact me if any additional questions.  ____________________________ Maren Reamer Sherrlyn Hock, MD srp:cbb D: 03/16/2011 18:05:19 ET T: 03/16/2011 19:07:02 ET JOB#: [id number]  cc: Sheretha Shadd R. Sherrlyn Hock, MD, <Dictator> Wille Celeste MD ELECTRONICALLY SIGNED 03/17/2011 10:04

## 2020-07-14 ENCOUNTER — Ambulatory Visit: Payer: PRIVATE HEALTH INSURANCE

## 2020-07-14 DIAGNOSIS — H25811 Combined forms of age-related cataract, right eye: Secondary | ICD-10-CM

## 2020-07-14 DIAGNOSIS — H401232 Low-tension glaucoma, bilateral, moderate stage: Secondary | ICD-10-CM

## 2020-07-14 MED ORDER — KETOROLAC TROMETHAMINE 0.4 % OP SOLN
1 [drp] | Freq: Four times a day (QID) | OPHTHALMIC | 2 refills | Status: AC
Start: 2020-07-14 — End: ?

## 2020-07-14 MED ORDER — PREDNISOLONE ACETATE 1 % OP SUSP
1 [drp] | Freq: Four times a day (QID) | OPHTHALMIC | 2 refills | Status: AC
Start: 2020-07-14 — End: ?

## 2020-07-14 NOTE — Progress Notes
Assessment and Plan     Problem List        Eye / Vision Problems    1. Low tension open-angle glaucoma     Overview      Continue lumigan both eyes at bedtime            2. 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     Dispense:  5 mL     Refill:  2    ketorolac (ACULAR LS) 0.4% ophthalmic solution     Sig: Place 1 drop into the right eye four (4) times daily Start 4x daily 3 days before surgery, then 4x daily one week, then 3x daily one week, then 2x daily one week, then 1x daily one week, then stop.     Dispense:  5 mL     Refill:  2       Follow ups:  Return for schedule cataract surgery OD monofocal distance (glasses for near); [facility] [place] already, imed done.         I discussed the above assessment and plan with the patient. He had the opportunity to ask questions, and his questions and concerns were addressed. He was reminded to call if there is any significant change or worsening in vision, or to get an evaluation, urgently if appropriate.    Author:   Alesia Richards, MD  This note details the assessment and plan of the encounter for this date. For a complete note and record of the encounter, please see the Encounter Summary.

## 2020-07-29 ENCOUNTER — Telehealth: Payer: PRIVATE HEALTH INSURANCE

## 2020-07-29 NOTE — Telephone Encounter
Hello,    Please call to schedule cataract surgery OD monofocal distance (glasses for near); [facility] [place] already, imed done.    Thank You!

## 2020-08-05 ENCOUNTER — Inpatient Hospital Stay: Payer: PRIVATE HEALTH INSURANCE

## 2020-08-05 ENCOUNTER — Non-Acute Institutional Stay: Payer: PRIVATE HEALTH INSURANCE

## 2020-08-05 NOTE — Telephone Encounter
Surgery scheduled 9/19.  Please sign consent in imed.

## 2020-09-06 MED ORDER — LUMIGAN 0.01 % OP SOLN
OPHTHALMIC | 3.00 refills | 33.50000 days
Start: 2020-09-06 — End: ?

## 2020-09-07 MED ORDER — LUMIGAN 0.01 % OP SOLN
3 refills | Status: AC
Start: 2020-09-07 — End: ?

## 2020-09-22 ENCOUNTER — Non-Acute Institutional Stay: Payer: MEDICARE

## 2020-10-10 ENCOUNTER — Non-Acute Institutional Stay: Payer: PRIVATE HEALTH INSURANCE

## 2020-10-13 ENCOUNTER — Non-Acute Institutional Stay: Payer: PRIVATE HEALTH INSURANCE

## 2020-12-01 ENCOUNTER — Inpatient Hospital Stay: Payer: MEDICARE

## 2020-12-01 ENCOUNTER — Telehealth: Payer: MEDICARE

## 2020-12-01 NOTE — Telephone Encounter
Patient scheduled for 02/15/2021. Do you need to see him before for a pre op his surgery was cx  In June

## 2020-12-21 MED ORDER — LUMIGAN 0.01 % OP SOLN
OPHTHALMIC | 3 refills | 33.50000 days
Start: 2020-12-21 — End: ?

## 2020-12-22 MED ORDER — LUMIGAN 0.01 % OP SOLN
3 refills | Status: AC
Start: 2020-12-22 — End: ?

## 2021-02-01 MED ORDER — LUMIGAN 0.01 % OP SOLN
OPHTHALMIC | 3 refills | 33.50000 days
Start: 2021-02-01 — End: ?

## 2021-02-02 MED ORDER — LUMIGAN 0.01 % OP SOLN
3 refills | Status: AC
Start: 2021-02-02 — End: ?

## 2021-02-04 MED ORDER — LUMIGAN 0.01 % OP SOLN
OPHTHALMIC | 3 refills | 33.50000 days
Start: 2021-02-04 — End: ?

## 2021-02-13 ENCOUNTER — Institutional Professional Consult (permissible substitution): Payer: PRIVATE HEALTH INSURANCE

## 2021-02-13 DIAGNOSIS — H25811 Combined forms of age-related cataract, right eye: Secondary | ICD-10-CM

## 2021-02-14 LAB — COVID-19 PCR/TMA: COVID-19 PCR/TMA: NOT DETECTED

## 2021-02-15 LAB — Glucose,POC: GLUCOSE,POC: 94 mg/dL (ref 65–99)

## 2021-02-15 MED ADMIN — EPINEPHRINE 1:1000 (1ML) WITH BSS (2ML): @ 19:00:00 | Stop: 2021-02-15

## 2021-02-15 MED ADMIN — PREDNISOLONE ACETATE 1 % OP SUSP: 1 [drp] | OPHTHALMIC | @ 17:00:00 | Stop: 2021-02-15 | NDC 61314063705

## 2021-02-15 MED ADMIN — MOXIFLOXACIN HCL 1 MG/ML IO SOLN: @ 19:00:00 | Stop: 2021-02-15 | NDC 71384050908

## 2021-02-15 MED ADMIN — MOXIFLOXACIN HCL 0.5 % OP SOLN: 1 [drp] | OPHTHALMIC | @ 17:00:00 | Stop: 2021-02-15 | NDC 68180042201

## 2021-02-15 MED ADMIN — BSS PLUS IO SOLN: @ 19:00:00 | Stop: 2021-02-15 | NDC 00065080050

## 2021-02-15 MED ADMIN — DEXAMETHASONE SOD PHOSPHATE PF 10 MG/ML IJ SOLN: @ 19:00:00 | Stop: 2021-02-15 | NDC 70069002125

## 2021-02-15 MED ADMIN — MIDAZOLAM HCL 10 MG/10ML IJ SOLN: INTRAVENOUS | @ 18:00:00 | Stop: 2021-02-15 | NDC 00409258705

## 2021-02-15 MED ADMIN — TETRACAINE HCL 0.5 % OP SOLN: @ 19:00:00 | Stop: 2021-02-15 | NDC 00065074114

## 2021-02-15 MED ADMIN — KETOROLAC TROMETHAMINE 0.5 % OP SOLN: 1 [drp] | OPHTHALMIC | @ 17:00:00 | Stop: 2021-02-15 | NDC 60505100301

## 2021-02-15 MED ADMIN — PHENYLEPHRINE HCL 2.5 % OP SOLN: 1 [drp] | OPHTHALMIC | @ 17:00:00 | Stop: 2021-02-15 | NDC 17478020102

## 2021-02-15 MED ADMIN — TROPICAMIDE 1 % OP SOLN: 1 [drp] | OPHTHALMIC | @ 17:00:00 | Stop: 2021-02-15 | NDC 17478010212

## 2021-02-15 MED ADMIN — SODIUM CHLORIDE 0.9 % IV SOLN: INTRAVENOUS | @ 18:00:00 | Stop: 2021-02-15 | NDC 00338004904

## 2021-02-15 MED ADMIN — BALANCED SALT IO SOLN: @ 19:00:00 | Stop: 2021-02-15

## 2021-02-15 MED ADMIN — NEOMYCIN-POLYMYXIN-DEXAMETH 3.5-10000-0.1 OP OINT: @ 19:00:00 | Stop: 2021-02-15 | NDC 24208079535

## 2021-02-15 MED ADMIN — LIDOCAINE HCL (PF) 1 % IJ SOLN: @ 19:00:00 | Stop: 2021-02-15 | NDC 63323049227

## 2021-02-15 NOTE — Op Note
22g IV removed from right forearm per protocol. Dressing applied. No complications.

## 2021-02-15 NOTE — H&P
Patient is a 88 year old male here for cataract extraction/IOL right eye. No change in H&P since visit with Dr Geryl Councilman on 09/07/2020, except patient suffered from covid in September. Recovered at home. No breathing problems.    Exam:  GEN Pleasant male, alert and oriented, NAD  HEENT cataract right eye   CV rrr  Lungs cta  Abd soft  Ext within normal limits    A/ cataract right eye  P/ cataract extraction/IOL right eye as scheduled

## 2021-02-15 NOTE — Discharge Instructions
FOR [facility] PATIENTS:  Alesia Richards M.D.  [address], [address] [place] [address]  PHONE [phone] FAX [phone]    FOR PRIVATE OFFICE PATIENTS:  Alesia Richards, M.D.  [phone] [facility] [address]  [place], [place] [address]  PHONE [phone] FAX [phone]    PATIENT POST-OPERATIVE INSTRUCTIONS    Keep the bandage on the operated eye the day of surgery and the first night. Remove and discard the bandage the next morning when you wake up.  You will start using the medicine drops the day after surgery once the bandage is removed.  Eye drops: as instructed by Dr. Rogene Houston office.  You will need to sleep with a shield over the eye for one week after surgery. You do not need the shield if you are not sleeping.  Sleep with the head elevated on two pillows the first night. Try to avoid sleeping on the side of the surgery.  Avoid any heavy lifting, jarring or jolting activities. Avoid bending down for extended periods of time. Walking, light stretching and cardiovascular exercise is okay starting the day after surgery.  Keep the eye dry. The eye should not be exposed to shower water, swimming or sauna for at least one week after surgery. It is okay to use no-rinse shampoos available at many pharmacies or someone can assist you in washing your hair backwards so the water does not drip into the eye. It is okay to bathe or shower below the neck.  It is okay to continue to use your old glasses or not use any glasses until the eye heals which is generally a month after surgery. At the time your prescription will be checked again.     IV SEDATION DISCHARGE INSTRUCTIONS ADULT    DIET: Your diet is based on your preference  Begin with clear liquids such as soda, apple juice, tea, etc.  DO NOT use a straw for drinking as this increases the amount of air swallowed and can increase bloating and stomach distress.    If liquids are tolerated, progress slowly to your usual diet.    NOTE: Intermittent nausea, with or without vomiting, is a common side effect following surgery with intravenous sedation.  If this occurs, DO NOT eat or drink anything until it has subsided.  If the nausea or vomiting persists for more than 24 hours contact your physician or go to the nearest emergency room.    ACTIVITY: You may gradually return to your usual activity if not restricted by your surgeon. Sedative medications may linger in the body for 24-48 hours.  It is not uncommon to feel somewhat drowsy and/or dizzy the remainder of the day following surgery.  Avoid bending over as this may enhance dizziness.  Do not drive an automobile or operate any heavy machinery for 24 hours.  Do not make sudden movements when changing position, such as from a reclining to upright position.  Do not sign any legal documents or make any important decisions for 24 hours. Do not drink alcohol for 24 hours.    ADDITIONAL INSTRUCTIONS: It is not uncommon to tire easily and/or feel exhausted the day following surgery.  Do not be alarmed as this is normal.  Plan activities that will allow for rest periods as needed.    For your safety, we strongly advise that a responsible adult remain with you for the rest of thesurgical day and night.  Also, you should not be responsible for the care of  others  (children/adults) for 24 hours post anesthesia and a responsible adult should be present to assume these responsibilities.

## 2021-02-15 NOTE — Op Note
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  Normal tension glaucoma both eyes    NAME OF OPERATION:  Cataract surgery with intraocular lens implant, right eye.      SURGEON:  Alesia Richards, MD [phone])      ANESTHESIA:  Topical and intraocular with monitored anesthesia care.    INDICATIONS:  Omar Beck is an 88 year old male who presented to the office with decreased vision and a visually significant mixed type cataract in the right eye. Risks and benefits were reviewed. He wished to proceed with cataract surgery. Consent was placed on the chart.    Omar Beck's subjective complaints, manifest refraction, and distance corrected visual acuity measurements are recorded in this and prior examination notes in the electronic medical record.     He reports the following visual limitations in a functional visual survey (if available): difficulty reading    No questionnaires on file.     He specifically complains, ''difficulty reading''    Is this second eye surgery? Yes    The patient and I had a thorough discussion of the examination findings. A visually significant cataract in the Right eye is present on slit-lamp biomicroscopy. The cataract appears to account for a significant portion of his visual complaints and problems with activities of daily living. The cataract is reducing his visual function to a level that cannot be improved by a tolerable change in glasses or contact lenses and cataract removal is likely to improve his visual and functional status.    We discussed the indications, risks, benefits, and alternatives of the various approaches to management of cataract, including continued observation, and the patient wishes to proceed with cataract surgery.    Attestation: I certify that the statements on this note are true to the best of my knowledge. Alesia Richards, MD     The findings and indications for this surgery were discussed thoroughly during the preoperative consultation. For examination findings and prior discussions, please refer to the initial consultation and preoperative examination notes. Omar Beck has a visually significant cataract in his Right eye on slit-lamp biomicroscopy, and it appears to account for a significant portion of his visual complaint and problems with activities of daily living. The cataract is reducing his visual function to a level that cannot be improved by a tolerable change in glasses or contact lenses, and cataract removal is likely to improve his visual and functional status. Having previously discussed the indications, risks, benefits, and alternatives of the various approaches to management of cataract, including continued observation, and the patient wishes to proceed with cataract surgery.       DESCRIPTION OF PROCEDURE:  The patient was taken to the operating room at [facility]. IV sedation was given per the anesthesiologist. Tetracaine drops were applied to the right eye for topical anesthesia. The eye was cleaned with Betadine and draped in sterile fashion for ophthalmic surgery. A speculum was used for exposure. The microscope was brought into position. A small blade was used to make a paracentesis at the 11 o'clock position. Nonpreserved lidocaine was injected through this port followed by 1:3000 dilution of epinephrine to augment the pupil dilation. EndoCoat was injected to protect the cornea and inflate the anterior chamber. A keratome was used to make a clear corneal incision at the 5 o'clock position. A  cystotome was used to initiate a circular capsulorrhexis, which was completed using capsulorrhexis forceps. Hydrodissection was performed using balanced salt solution on a blunt cannula. Phacoemulsification was used to emulsify and aspirate the cataract. Irrigation and aspiration were used to remove the remaining cortex. Healon was used to inflate the capsular bag. Capsule polishers were used to dislodge any residual cortex. A posterior chamber lens was then injected into the bag using an injector (model Tecnis ZCB00, 19.5 diopter, serial #[id number], Laural [employer] and [employer]).  Irrigation and aspiration were used to remove the viscoelastic from the eye. Balanced salt solution was used to inflate the anterior chamber and hydrate the corneal wounds. Moxifloxacin was given intracamerally. A subconjunctival injection of dexamethasone was given. At the end of the procedure, the lens was well centered in the bag, the eye had normal tension, and the wound was watertight.  The patient tolerated the procedure well. The eye was given Maxitrol ointment, patched, and shielded. The patient was taken to recovery in good condition and instructed to return the next day for a postop check.    COMPLICATIONS:  None.    ESTIMATED BLOOD LOSS:  None.    SPECIMENS:  None.      Alesia Richards, MD [phone])        BJ/MODL CONF#: [id number]  D: 02/15/2021 11:02:21 T: 02/15/2021 11:37:16 DOCUMENT: [id number]

## 2021-02-15 NOTE — H&P
UPDATED H&P REQUIREMENT    For [facility] and [facility]    WHAT IS THE STATUS OF THE PATIENT'S MOST CURRENT HISTORY AND PHYSICAL?   - The most current H&P is >24 hours and <30 days, and having examined the patient, I attest that there have been no changes. (This suffices as an update to the H&P).      REFER TO MEDICAL STAFF POLICIES REGARDING PRE-PROCEDURE HISTORY AND PHYSICAL EXAMINATION AND UPDATED H&P REQUIREMENTS BELOW:    [facility] and [facility] Medical Staff Policy 200 - For Patients Undergoing Procedures Requiring Moderate or Deep Sedation, General Anesthesia or Regional Anesthesia    Contents of a History and Physical Examination (H&P):    The H&P shall consist of chief complaint, history of present illness, allergies and medications, relevant social and family history, past medical history, review of systems and physical examination, and assessment and plan appropriate to the patient's age.    For Patients Undergoing Procedures Requiring Moderate or Deep Sedation, General Anesthesia or Regional Anesthesia:    1. An H&P shall be performed within 24 hours prior to the procedure by a qualified member of the medical staff or designee with appropriate privileges, except as noted in item 2 below.    2. If a complete history and physical was performed within thirty (30) calendar days prior to the patient's admission to the Medical Center for elective surgery, a member of the medical staff assumes the responsibility for the accuracy of the clinical information and will need to document in the medical record within twenty-four (24) hours of admission and prior to surgery or major invasive procedure, that they either attest that the history and physical has been reviewed and accepted, or document an update of the original history and physical relevant to the patient's current  clinical status.    3. Providing an H&P for patients undergoing surgery under local anesthesia is at the discretion of the Attending Physician.     4. When a procedure is performed by a dentist, podiatrist or other practitioner who is not privileged to perform an H&P, the anesthesiologist's assessment immediately prior to the procedure will constitute the 24 hour re-assessment.The dentist, podiatrist or other practitioner who is not privileged to perform an H&P will document the history and physical relevant to the procedure.    5. If the H&P and the written informed consent for the surgery or procedure are not recorded in the patient's medical record prior to surgery, the operation shall not be performed unless the attending physician states in writing that such a delay could lead to an adverse event or irreversible damage to the patient.    6. The above requirements shall not preclude the rendering of emergency medical or surgical care to a patient in dire circumstances.

## 2021-02-16 ENCOUNTER — Ambulatory Visit: Payer: MEDICARE

## 2021-02-16 DIAGNOSIS — H401231 Low-tension glaucoma, bilateral, mild stage: Secondary | ICD-10-CM

## 2021-02-16 DIAGNOSIS — Z961 Presence of intraocular lens: Secondary | ICD-10-CM

## 2021-02-16 LAB — Glucose,POC: GLUCOSE,POC: 87 mg/dL (ref 65–99)

## 2021-02-16 NOTE — Progress Notes
Assessment and Plan     Problem List        Eye / Vision Problems    1. Low tension open-angle glaucoma     Overview      Continue lumigan both eyes at bedtime            2. Pseudophakia     Overview      Doing well postoperative day one s/p cataract extraction/IOL right eye   on 02/15/2021    Postop precautions  Prednisolone acetate 4x acular 4x  F/u one week       Low tension open-angle glaucoma              Orders:  No orders of the defined types were placed in this encounter.      Follow ups:  Return in about 1 week (around 02/23/2021) for postop week 1 OD.         I discussed the above assessment and plan with the patient. He had the opportunity to ask questions, and his questions and concerns were addressed. He was reminded to call if there is any significant change or worsening in vision, or to get an evaluation, urgently if appropriate.    Author:   Alesia Richards, MD  This note details the assessment and plan of the encounter for this date. For a complete note and record of the encounter, please see the Encounter Summary.

## 2021-02-25 ENCOUNTER — Ambulatory Visit: Payer: MEDICARE

## 2021-02-25 DIAGNOSIS — H401232 Low-tension glaucoma, bilateral, moderate stage: Secondary | ICD-10-CM

## 2021-02-25 NOTE — Progress Notes
Assessment and Plan     Problem List        Eye / Vision Problems    1. Low tension open-angle glaucoma     Overview      Continue lumigan both eyes at bedtime              2. Pseudophakia     Overview      Doing well postoperative week one s/p cataract extraction/IOL right eye   on 02/15/2021    Taper Prednisolone acetate 4x acular 4x  F/u 4 weeks (at [facility])       Pseudophakia       Low tension open-angle glaucoma              Orders:  No orders of the defined types were placed in this encounter.      Follow ups:  Return in about 4 weeks (around 03/25/2021) for will f/u at [facility] eye at [place].         I discussed the above assessment and plan with the patient. He had the opportunity to ask questions, and his questions and concerns were addressed. He was reminded to call if there is any significant change or worsening in vision, or to get an evaluation, urgently if appropriate.    Author:   Lenice Pressman, MD  This note details the assessment and plan of the encounter for this date. For a complete note and record of the encounter, please see the Encounter Summary.

## 2021-10-05 MED ORDER — LUMIGAN 0.01 % OP SOLN
1 [drp] | Freq: Every evening | OPHTHALMIC | 3 refills | Status: AC
Start: 2021-10-05 — End: ?

## 2021-10-05 NOTE — Addendum Note
Addended by: Barbee Shropshire on: 10/05/2021 03:18 PM     Modules accepted: Orders

## 2021-10-11 ENCOUNTER — Telehealth: Payer: PRIVATE HEALTH INSURANCE

## 2021-10-11 NOTE — Telephone Encounter
Hi Dr. Cherly Anderson,    [facility] sent a fax stating Lumigan is not covered, please order an alternative medication for your patient.    Thank you,    Cristopher Peru

## 2021-10-12 ENCOUNTER — Ambulatory Visit: Payer: MEDICARE

## 2021-10-12 MED ORDER — LATANOPROST 0.005 % OP SOLN
1 [drp] | Freq: Every evening | OPHTHALMIC | 4 refills | Status: AC
Start: 2021-10-12 — End: ?

## 2022-10-11 ENCOUNTER — Ambulatory Visit: Payer: PRIVATE HEALTH INSURANCE

## 2022-10-11 ENCOUNTER — Inpatient Hospital Stay: Payer: PRIVATE HEALTH INSURANCE

## 2022-10-11 DIAGNOSIS — H401232 Low-tension glaucoma, bilateral, moderate stage: Secondary | ICD-10-CM

## 2022-10-11 DIAGNOSIS — H26491 Other secondary cataract, right eye: Secondary | ICD-10-CM

## 2022-10-11 NOTE — Progress Notes
Assessment and Plan      Problem List        Eye/Vision Problems    1. Right posterior capsular opacification     Overview      Visually significant right eye, referred by optometry to have laser    Risks/benefits/questions reviewed  Wishes to proceed  Schedule yag laser capsulotomy right eye          2. Low tension open-angle glaucoma - Primary     Overview      Continue lumigan both eyes at bedtime     Oct nerve 10/11/2022 thinning both eyes    Visual field after yag laser right eye           Current Assessment & Plan                       Orders:  Orders Placed This Encounter    OCT OPTIC NERVE HEIDELBERG OU (BOTH EYES)     Technician Instructions:     Scheduling Instructions:      Patient Instructions:       Follow ups:  Return for schedule yag laser capsulotomy OD; [facility] please.         I discussed the above assessment and plan with the patient. He had the opportunity to ask questions, and his questions and concerns were addressed. He was reminded to call if there is any significant change or worsening in vision, or to get an evaluation, urgently if appropriate.    Author:   Alesia Richards, MD  This note details the assessment and plan of the encounter for this date. For a complete note and record of the encounter, please see the Encounter Summary.

## 2022-11-29 ENCOUNTER — Ambulatory Visit: Payer: Commercial Managed Care - Pharmacy Benefit Manager

## 2022-11-29 MED ORDER — PREDNISOLONE ACETATE 1 % OP SUSP
1 [drp] | Freq: Four times a day (QID) | OPHTHALMIC | 1 refills | 20.00000 days | Status: AC
Start: 2022-11-29 — End: ?

## 2022-11-29 MED ORDER — PREDNISOLONE ACETATE 1 % OP SUSP
1 [drp] | Freq: Four times a day (QID) | OPHTHALMIC | 1 refills | Status: AC
Start: 2022-11-29 — End: 2022-11-30
  Filled 2022-11-30: qty 5, 20d supply, fill #0

## 2022-11-29 MED ADMIN — TROPICAMIDE 1 % OP SOLN: 1 [drp] | OPHTHALMIC | @ 21:00:00 | Stop: 2022-11-29 | NDC 70069012101

## 2022-11-29 MED ADMIN — PROPARACAINE HCL 0.5 % OP SOLN: 1 [drp] | OPHTHALMIC | @ 23:00:00 | Stop: 2022-11-29 | NDC 24208073006

## 2022-11-29 NOTE — Op Note
DATE OF OPERATION:  11/29/2022     ATTENDING PHYSICIAN:  Alesia Richards, MD [phone])     PREOPERATIVE DIAGNOSIS:  Posterior capsular opacity, right  eye, obscuring vision.     POSTOPERATIVE DIAGNOSIS:  Posterior capsular opacity, right  eye, obscuring vision.     NAME OF OPERATION:  YAG laser capsulotomy, right eye.        SURGEON:  Alesia Richards, MD [phone])        ANESTHESIA:  Topical.     INDICATIONS:   This patient presented to the office with decreased vision and a thickened posterior capsule of the right eye obscuring vision. Risks and benefits were reviewed. Patient wished to proceed with laser capsulotomy in the right eye. Consent was placed on the chart.     DESCRIPTION OF PROCEDURE:  The patient was taken to the laser suite at [facility]. One drop of 1% Mydriacyl was used to dilate the right pupil. Proparacaine eye drops were used for topical anesthesia. The patient was seated at the slit lamp of the laser and Abraham capsulotomy lens was placed with GenTeal on the right cornea. The posterior capsule was brought into focus. A setting of 2.4 mJ was used. A total of 24 spots were applied. This resulted in a nice central opening in the posterior capsular opacity. The patient tolerated the procedure well. Patient was instructed to use prednisolone eye drops 4 times a day for 4 days and to follow up at the office for a postop check.     COMPLICATIONS:  None.     ESTIMATED BLOOD LOSS:  None.     SPECIMENS:  None.        Alesia Richards, MD [phone])

## 2022-12-01 ENCOUNTER — Ambulatory Visit: Payer: Commercial Managed Care - Pharmacy Benefit Manager

## 2022-12-06 ENCOUNTER — Ambulatory Visit: Payer: Commercial Managed Care - Pharmacy Benefit Manager

## 2022-12-06 DIAGNOSIS — H401232 Low-tension glaucoma, bilateral, moderate stage: Secondary | ICD-10-CM

## 2022-12-06 NOTE — Progress Notes
Assessment and Plan      Problem List        Eye/Vision Problems    1. Right posterior capsular opacification - Primary     Overview      Doing well s/p yag laser capsulotomy right eye 11/28/2022  Doing well s/p yag laser capsulotomy left eye           Current Assessment & Plan                2. Low tension open-angle glaucoma     Overview      Continue lumigan both eyes at bedtime     Oct nerve 10/11/2022 thinning both eyes    F/u with oct nerve and visual field           Current Assessment & Plan                       Orders:  No orders of the defined types were placed in this encounter.      Follow ups:  Return in about 6 months (around 06/05/2023) for dilation, oct nerve and VF.         I discussed the above assessment and plan with the patient. He had the opportunity to ask questions, and his questions and concerns were addressed. He was reminded to call if there is any significant change or worsening in vision, or to get an evaluation, urgently if appropriate.    Author:   Alesia Richards, MD  This note details the assessment and plan of the encounter for this date. For a complete note and record of the encounter, please see the Encounter Summary.

## 2022-12-19 MED ORDER — PREDNISOLONE ACETATE 1 % OP SUSP
2 refills | 20.00000 days
Start: 2022-12-19 — End: ?

## 2022-12-31 MED ORDER — LUMIGAN 0.01 % OP SOLN
3 refills
Start: 2022-12-31 — End: ?

## 2023-01-02 MED ORDER — LUMIGAN 0.01 % OP SOLN
3 refills | Status: AC
Start: 2023-01-02 — End: ?

## 2023-06-06 ENCOUNTER — Ambulatory Visit: Payer: Commercial Managed Care - Pharmacy Benefit Manager

## 2023-06-28 ENCOUNTER — Telehealth: Payer: MEDICARE

## 2023-06-28 MED ORDER — LUMIGAN 0.01 % OP SOLN
1 [drp] | Freq: Every evening | OPHTHALMIC | 2 refills | 75.00000 days | Status: AC
Start: 2023-06-28 — End: ?

## 2023-06-28 NOTE — Telephone Encounter
 Contacted patient at the request of tech left a voicemail to call office - Jafri, MD is requesting the patient to schedule a follow up appointment   ''Return in about 6 months (around 06/05/2023) for dilation, oct nerve and VF''

## 2023-07-20 ENCOUNTER — Ambulatory Visit: Payer: PRIVATE HEALTH INSURANCE

## 2023-07-20 DIAGNOSIS — H401232 Low-tension glaucoma, bilateral, moderate stage: Secondary | ICD-10-CM

## 2023-07-20 MED ORDER — LUMIGAN 0.01 % OP SOLN
1 [drp] | Freq: Every evening | OPHTHALMIC | 4 refills | 30.00000 days | Status: AC
Start: 2023-07-20 — End: ?

## 2023-07-20 NOTE — Progress Notes
 Assessment and Plan      Problem List        Eye/Vision Problems    1. Low tension open-angle glaucoma - Primary     Overview   Continue lumigan  both eyes at bedtime     Oct nerve 07/20/2023 stable both eyes, 10/11/2022 thinning both eyes  Visual field 07/20/2023 nonspecific defects, unreliable due to high   fixation losses    F/u 6 mo for intraocular pressure check and optos          Current Assessment & Plan                    Orders:  Orders Placed This Encounter    bimatoprost  (LUMIGAN ) 0.01% ophthalmic solution     Sig: Place 1 drop into both eyes at bedtime.     Dispense:  7.5 mL     Refill:  4       Follow ups:  Return in about 6 months (around 01/19/2024) for dilation and Optos.         I discussed the above assessment and plan with the patient. He had the opportunity to ask questions, and his questions and concerns were addressed. He was reminded to call if there is any significant change or worsening in vision, or to get an evaluation, urgently if appropriate.    Author:   Bennetta Charity, MD  This note details the assessment and plan of the encounter for this date. For a complete note and record of the encounter, please see the Encounter Summary.

## 2024-01-09 ENCOUNTER — Ambulatory Visit: Payer: MEDICARE

## 2024-01-09 ENCOUNTER — Ambulatory Visit: Payer: PRIVATE HEALTH INSURANCE

## 2024-01-29 MED ORDER — LUMIGAN 0.01 % OP SOLN
OPHTHALMIC | 2 refills | 60.00000 days | Status: AC
Start: 2024-01-29 — End: ?
